# Patient Record
Sex: Female | Born: 1965
Health system: Southern US, Community
[De-identification: ages and names within clinical notes are randomized; demographics above are authoritative.]

## PROBLEM LIST (undated history)

## (undated) DIAGNOSIS — I10 Essential (primary) hypertension: Secondary | ICD-10-CM

## (undated) HISTORY — PX: BREAST EXCISIONAL BIOPSY: SUR124

---

## 2009-04-23 ENCOUNTER — Inpatient Hospital Stay (HOSPITAL_COMMUNITY): Admission: AD | Admit: 2009-04-23 | Discharge: 2009-04-25 | Payer: Self-pay | Admitting: Obstetrics and Gynecology

## 2009-04-23 ENCOUNTER — Ambulatory Visit: Payer: Self-pay | Admitting: Family Medicine

## 2009-05-16 ENCOUNTER — Encounter: Admission: RE | Admit: 2009-05-16 | Discharge: 2009-05-16 | Payer: Self-pay | Admitting: Family Medicine

## 2009-08-22 ENCOUNTER — Encounter: Admission: RE | Admit: 2009-08-22 | Discharge: 2009-08-22 | Payer: Self-pay | Admitting: Family Medicine

## 2010-12-22 ENCOUNTER — Encounter: Payer: Self-pay | Admitting: *Deleted

## 2011-02-24 ENCOUNTER — Emergency Department (HOSPITAL_COMMUNITY)
Admission: EM | Admit: 2011-02-24 | Discharge: 2011-02-25 | Disposition: A | Discharge status: Home / Self Care | Payer: Self-pay | Attending: Emergency Medicine | Admitting: Emergency Medicine

## 2011-02-24 ENCOUNTER — Emergency Department (HOSPITAL_COMMUNITY): Payer: Self-pay

## 2011-02-24 DIAGNOSIS — M545 Low back pain, unspecified: Secondary | ICD-10-CM | POA: Insufficient documentation

## 2011-02-24 DIAGNOSIS — R079 Chest pain, unspecified: Secondary | ICD-10-CM | POA: Insufficient documentation

## 2011-02-24 DIAGNOSIS — M25519 Pain in unspecified shoulder: Secondary | ICD-10-CM | POA: Insufficient documentation

## 2011-02-25 LAB — POCT CARDIAC MARKERS: Myoglobin, poc: 62.6 ng/mL (ref 12–200)

## 2011-03-11 LAB — CBC
HCT: 39.6 % (ref 36.0–46.0)
MCHC: 35 g/dL (ref 30.0–36.0)
MCV: 102.1 fL — ABNORMAL HIGH (ref 78.0–100.0)
Platelets: 198 10*3/uL (ref 150–400)
RDW: 14.2 % (ref 11.5–15.5)

## 2012-12-28 ENCOUNTER — Emergency Department (HOSPITAL_COMMUNITY)
Admission: EM | Admit: 2012-12-28 | Discharge: 2012-12-28 | Disposition: A | Discharge status: Home / Self Care | Payer: BC Managed Care – PPO | Attending: Emergency Medicine | Admitting: Emergency Medicine

## 2012-12-28 ENCOUNTER — Emergency Department (HOSPITAL_COMMUNITY): Payer: BC Managed Care – PPO

## 2012-12-28 ENCOUNTER — Encounter (HOSPITAL_COMMUNITY): Payer: Self-pay | Admitting: *Deleted

## 2012-12-28 DIAGNOSIS — J40 Bronchitis, not specified as acute or chronic: Secondary | ICD-10-CM | POA: Insufficient documentation

## 2012-12-28 DIAGNOSIS — X58XXXA Exposure to other specified factors, initial encounter: Secondary | ICD-10-CM | POA: Insufficient documentation

## 2012-12-28 DIAGNOSIS — Y929 Unspecified place or not applicable: Secondary | ICD-10-CM | POA: Insufficient documentation

## 2012-12-28 DIAGNOSIS — S29011A Strain of muscle and tendon of front wall of thorax, initial encounter: Secondary | ICD-10-CM

## 2012-12-28 DIAGNOSIS — IMO0002 Reserved for concepts with insufficient information to code with codable children: Secondary | ICD-10-CM | POA: Insufficient documentation

## 2012-12-28 DIAGNOSIS — R059 Cough, unspecified: Secondary | ICD-10-CM | POA: Insufficient documentation

## 2012-12-28 DIAGNOSIS — R05 Cough: Secondary | ICD-10-CM | POA: Insufficient documentation

## 2012-12-28 DIAGNOSIS — Y939 Activity, unspecified: Secondary | ICD-10-CM | POA: Insufficient documentation

## 2012-12-28 LAB — CBC
Hemoglobin: 13.4 g/dL (ref 12.0–15.0)
MCH: 32.4 pg (ref 26.0–34.0)
MCHC: 34 g/dL (ref 30.0–36.0)

## 2012-12-28 LAB — BASIC METABOLIC PANEL
BUN: 11 mg/dL (ref 6–23)
Calcium: 9.7 mg/dL (ref 8.4–10.5)
Creatinine, Ser: 0.85 mg/dL (ref 0.50–1.10)
GFR calc non Af Amer: 81 mL/min — ABNORMAL LOW (ref >90)
Glucose, Bld: 98 mg/dL (ref 70–99)
Potassium: 3.4 mEq/L — ABNORMAL LOW (ref 3.5–5.1)

## 2012-12-28 LAB — POCT I-STAT TROPONIN I: Troponin i, poc: 0.01 ng/mL (ref 0.00–0.08)

## 2012-12-28 MED ORDER — HYDROCOD POLST-CHLORPHEN POLST 10-8 MG/5ML PO LQCR: 5.0000 mL | Freq: Two times a day (BID) | ORAL | Status: AC | PRN

## 2012-12-28 MED ORDER — HYDROCOD POLST-CHLORPHEN POLST 10-8 MG/5ML PO LQCR
5.0000 mL | Freq: Once | ORAL | Status: AC
  Administered 2012-12-28: 5 mL via ORAL
  Filled 2012-12-28: qty 5

## 2012-12-28 MED ORDER — IBUPROFEN 800 MG PO TABS: 800.0000 mg | ORAL_TABLET | Freq: Four times a day (QID) | ORAL | Status: AC | PRN

## 2012-12-28 MED ORDER — IBUPROFEN 800 MG PO TABS
800.0000 mg | ORAL_TABLET | Freq: Once | ORAL | Status: AC
  Administered 2012-12-28: 800 mg via ORAL
  Filled 2012-12-28: qty 1

## 2012-12-28 MED ORDER — AZITHROMYCIN 250 MG PO TABS: ORAL_TABLET | ORAL | Status: DC

## 2012-12-28 MED ORDER — AZITHROMYCIN 250 MG PO TABS
500.0000 mg | ORAL_TABLET | Freq: Once | ORAL | Status: AC
  Administered 2012-12-28: 500 mg via ORAL
  Filled 2012-12-28: qty 2

## 2012-12-28 NOTE — ED Notes (Signed)
Patient complaining of chest pain that started about two hours ago; woke patient up from sleep tonight.  Pain is constant; location is center of chest. Patient denies shortness of breath; pain worsens upon cough and deep inspiration.  Patient reports cough x2 weeks; denies fever.  Initially sputum was green/yellow (two weeks ago); is now clear.  Denies smoking history.

## 2012-12-28 NOTE — ED Notes (Signed)
Patient given copy of discharge paperwork; went over discharge instructions with patient.  Patient instructed to take prescriptions as directed, to finish entire antibiotic prescription, to not drive while taking narcotics, to follow up with primary care physician, and to return to the ED for new, worsening, or concerning symptoms.

## 2012-12-28 NOTE — ED Provider Notes (Signed)
History     CSN: [id number]  Arrival date & time 12/28/12  0204   First MD Initiated Contact with Patient 12/28/12 0254      Chief Complaint  Patient presents with  . Chest Pain    (Consider location/radiation/quality/duration/timing/severity/associated sxs/prior treatment) HPI 47 year old female presents to emergency room complaining of chest pain. She reports she woke this morning around 1 AM with sharp central chest pain. It is worse when she takes a breath, moves or palpates her chest. She reports she has had 2 weeks of worsening cough. She denies any fever. She's not a smoker. She denies any previous medical problems. No leg swelling, low no prolonged immobilization no other risk factors for PE or DVT.  History reviewed. No pertinent past medical history.  History reviewed. No pertinent past surgical history.  History reviewed. No pertinent family history.  History  Substance Use Topics  . Smoking status: Never Smoker   . Smokeless tobacco: Not on file  . Alcohol Use: No    OB History    Grav Para Term Preterm Abortions TAB SAB Ect Mult Living                  Review of Systems  See History of Present Illness; otherwise all other systems are reviewed and negative  Allergies  Quinine derivatives  Home Medications   Current Outpatient Rx  Name  Route  Sig  Dispense  Refill  . AZITHROMYCIN 250 MG PO TABS      Take one tab po q day starting on 1/29   4 each   0   . HYDROCOD POLST-CPM POLST ER 10-8 MG/5ML PO LQCR   Oral   Take 5 mLs by mouth every 12 (twelve) hours as needed (cough).   140 mL   0   . IBUPROFEN 800 MG PO TABS   Oral   Take 1 tablet (800 mg total) by mouth every 6 (six) hours as needed for pain.   30 tablet   0     BP 117/68  Pulse 76  Temp 98.2 F (36.8 C) (Oral)  Resp 16  SpO2 100%  LMP 11/27/2012  Physical Exam  Nursing note and vitals reviewed. Constitutional: She is oriented to person, place, and time. She appears  well-developed and well-nourished. No distress.  HENT:  Head: Normocephalic and atraumatic.  Nose: Nose normal.  Mouth/Throat: Oropharynx is clear and moist.  Eyes: Conjunctivae normal and EOM are normal. Pupils are equal, round, and reactive to light.  Neck: Normal range of motion. Neck supple. No JVD present. No tracheal deviation present. No thyromegaly present.  Cardiovascular: Normal rate, regular rhythm, normal heart sounds and intact distal pulses.  Exam reveals no gallop and no friction rub.   No murmur heard. Pulmonary/Chest: Effort normal and breath sounds normal. No stridor. No respiratory distress. She has no wheezes. She has no rales. She exhibits tenderness (palpation of the chest reproduces pain).       Patient has cough  Abdominal: Soft. Bowel sounds are normal. She exhibits no distension and no mass. There is no tenderness. There is no rebound and no guarding.  Musculoskeletal: Normal range of motion. She exhibits no edema and no tenderness.  Lymphadenopathy:    She has no cervical adenopathy.  Neurological: She is alert and oriented to person, place, and time. She exhibits normal muscle tone. Coordination normal.  Skin: Skin is warm and dry. No rash noted. No erythema. No pallor.  Psychiatric: She has a  normal mood and affect. Her behavior is normal. Judgment and thought content normal.    ED Course  Procedures (including critical care time)  Labs Reviewed  BASIC METABOLIC PANEL - Abnormal; Notable for the following:    Sodium 134 (*)     Potassium 3.4 (*)     GFR calc non Af Amer 81 (*)     All other components within normal limits  CBC  POCT I-STAT TROPONIN I  LAB REPORT - SCANNED   Dg Chest 2 View  12/28/2012  *RADIOLOGY REPORT*  Clinical Data: Chest pain  CHEST - 2 VIEW  Comparison: 02/24/2011  Findings: Mild bronchitic change.  Lungs are otherwise clear. No pleural effusion or pneumothorax. The cardiomediastinal contours are within normal limits. The  visualized bones and soft tissues are without significant appreciable abnormality.  IMPRESSION: Mild bronchitic change without confluent airspace opacity.   Original Report Authenticated By: Jearld Lesch, M.D.     Date: 12/28/2012  Rate:60  Rhythm: normal sinus rhythm  QRS Axis: normal  Intervals: normal  ST/T Wave abnormalities: normal  Conduction Disutrbances:none  Narrative Interpretation:   Old EKG Reviewed: unchanged    1. Bronchitis   2. Chest wall muscle strain       MDM  47 year old female with chest pain. Pain appears to be chest wall pain, she's had recent cough for 2 weeks. EKG unremarkable. Palpation of chest reproduces the pain. Will treat pain and cough.        Olivia Mackie, MD 12/28/12 [phone]

## 2012-12-28 NOTE — ED Notes (Signed)
Dr. Norlene Campbell at bedside explaining test results.

## 2012-12-28 NOTE — ED Notes (Signed)
Pt states that about an hour ago she was woke from her sleep with mid-sternal CP, sharp in nature.  Denies SOB, diaphoresis, weakness, dizziness.  Does feel slightly nauseous.

## 2012-12-28 NOTE — ED Notes (Signed)
I-Stat Troponin collected by phlebotomist.

## 2013-05-12 ENCOUNTER — Other Ambulatory Visit (HOSPITAL_COMMUNITY)
Admission: RE | Admit: 2013-05-12 | Discharge: 2013-05-12 | Disposition: A | Discharge status: Home / Self Care | Payer: BC Managed Care – PPO | Source: Ambulatory Visit | Attending: Family Medicine | Admitting: Family Medicine

## 2013-05-12 DIAGNOSIS — Z124 Encounter for screening for malignant neoplasm of cervix: Secondary | ICD-10-CM | POA: Insufficient documentation

## 2013-05-12 DIAGNOSIS — Z1151 Encounter for screening for human papillomavirus (HPV): Secondary | ICD-10-CM | POA: Insufficient documentation

## 2013-06-13 ENCOUNTER — Other Ambulatory Visit: Payer: Self-pay

## 2013-06-13 DIAGNOSIS — Z1231 Encounter for screening mammogram for malignant neoplasm of breast: Secondary | ICD-10-CM

## 2013-07-06 ENCOUNTER — Ambulatory Visit
Admission: RE | Admit: 2013-07-06 | Discharge: 2013-07-06 | Disposition: A | Discharge status: Home / Self Care | Payer: BC Managed Care – PPO | Source: Ambulatory Visit

## 2013-07-06 DIAGNOSIS — Z1231 Encounter for screening mammogram for malignant neoplasm of breast: Secondary | ICD-10-CM

## 2014-07-18 ENCOUNTER — Other Ambulatory Visit: Payer: Self-pay | Admitting: Gastroenterology

## 2014-07-18 DIAGNOSIS — R1013 Epigastric pain: Secondary | ICD-10-CM

## 2014-07-18 DIAGNOSIS — R194 Change in bowel habit: Secondary | ICD-10-CM

## 2014-07-28 ENCOUNTER — Ambulatory Visit
Admission: RE | Admit: 2014-07-28 | Discharge: 2014-07-28 | Disposition: A | Discharge status: Home / Self Care | Payer: PRIVATE HEALTH INSURANCE | Source: Ambulatory Visit | Attending: Gastroenterology | Admitting: Gastroenterology

## 2014-07-28 DIAGNOSIS — R194 Change in bowel habit: Secondary | ICD-10-CM

## 2014-07-28 DIAGNOSIS — R1013 Epigastric pain: Secondary | ICD-10-CM

## 2014-07-28 MED ORDER — IOHEXOL 300 MG/ML  SOLN
100.0000 mL | Freq: Once | INTRAMUSCULAR | Status: AC | PRN
  Administered 2014-07-28: 100 mL via INTRAVENOUS

## 2014-08-25 ENCOUNTER — Other Ambulatory Visit: Payer: Self-pay | Admitting: Family Medicine

## 2014-08-25 ENCOUNTER — Other Ambulatory Visit (HOSPITAL_COMMUNITY)
Admission: RE | Admit: 2014-08-25 | Discharge: 2014-08-25 | Disposition: A | Discharge status: Home / Self Care | Payer: PRIVATE HEALTH INSURANCE | Source: Ambulatory Visit | Attending: Family Medicine | Admitting: Family Medicine

## 2014-08-25 DIAGNOSIS — Z1151 Encounter for screening for human papillomavirus (HPV): Secondary | ICD-10-CM | POA: Insufficient documentation

## 2014-08-25 DIAGNOSIS — Z113 Encounter for screening for infections with a predominantly sexual mode of transmission: Secondary | ICD-10-CM | POA: Insufficient documentation

## 2014-08-25 DIAGNOSIS — Z124 Encounter for screening for malignant neoplasm of cervix: Secondary | ICD-10-CM | POA: Insufficient documentation

## 2014-08-25 DIAGNOSIS — R87810 Cervical high risk human papillomavirus (HPV) DNA test positive: Secondary | ICD-10-CM | POA: Insufficient documentation

## 2014-08-29 LAB — CYTOLOGY - PAP

## 2014-10-02 ENCOUNTER — Other Ambulatory Visit: Payer: Self-pay

## 2014-10-02 DIAGNOSIS — Z1231 Encounter for screening mammogram for malignant neoplasm of breast: Secondary | ICD-10-CM

## 2014-10-25 ENCOUNTER — Ambulatory Visit
Admission: RE | Admit: 2014-10-25 | Discharge: 2014-10-25 | Disposition: A | Discharge status: Home / Self Care | Payer: PRIVATE HEALTH INSURANCE | Source: Ambulatory Visit

## 2014-10-25 DIAGNOSIS — Z1231 Encounter for screening mammogram for malignant neoplasm of breast: Secondary | ICD-10-CM

## 2015-09-21 ENCOUNTER — Other Ambulatory Visit: Payer: Self-pay

## 2015-09-21 DIAGNOSIS — Z1231 Encounter for screening mammogram for malignant neoplasm of breast: Secondary | ICD-10-CM

## 2015-09-26 ENCOUNTER — Other Ambulatory Visit: Payer: Self-pay | Admitting: Family Medicine

## 2015-09-26 ENCOUNTER — Other Ambulatory Visit (HOSPITAL_COMMUNITY)
Admission: RE | Admit: 2015-09-26 | Discharge: 2015-09-26 | Disposition: A | Discharge status: Home / Self Care | Payer: PRIVATE HEALTH INSURANCE | Source: Ambulatory Visit | Attending: Family Medicine | Admitting: Family Medicine

## 2015-09-26 DIAGNOSIS — R87619 Unspecified abnormal cytological findings in specimens from cervix uteri: Secondary | ICD-10-CM | POA: Insufficient documentation

## 2015-09-26 DIAGNOSIS — Z1151 Encounter for screening for human papillomavirus (HPV): Secondary | ICD-10-CM | POA: Insufficient documentation

## 2015-10-01 LAB — CYTOLOGY - PAP

## 2015-12-07 ENCOUNTER — Other Ambulatory Visit: Payer: Self-pay | Admitting: Family Medicine

## 2015-12-07 DIAGNOSIS — R102 Pelvic and perineal pain: Secondary | ICD-10-CM

## 2015-12-13 ENCOUNTER — Other Ambulatory Visit: Payer: PRIVATE HEALTH INSURANCE

## 2015-12-14 ENCOUNTER — Other Ambulatory Visit: Payer: PRIVATE HEALTH INSURANCE

## 2015-12-21 ENCOUNTER — Ambulatory Visit
Admission: RE | Admit: 2015-12-21 | Discharge: 2015-12-21 | Disposition: A | Discharge status: Home / Self Care | Payer: PRIVATE HEALTH INSURANCE | Source: Ambulatory Visit | Attending: Family Medicine | Admitting: Family Medicine

## 2015-12-21 DIAGNOSIS — R102 Pelvic and perineal pain: Secondary | ICD-10-CM

## 2016-06-11 ENCOUNTER — Other Ambulatory Visit: Payer: Self-pay | Admitting: Family Medicine

## 2016-06-11 DIAGNOSIS — N83209 Unspecified ovarian cyst, unspecified side: Secondary | ICD-10-CM

## 2016-08-21 ENCOUNTER — Ambulatory Visit
Admission: RE | Admit: 2016-08-21 | Discharge: 2016-08-21 | Disposition: A | Discharge status: Home / Self Care | Payer: Commercial Managed Care - PPO | Source: Ambulatory Visit | Attending: Family Medicine | Admitting: Family Medicine

## 2016-08-21 DIAGNOSIS — N83209 Unspecified ovarian cyst, unspecified side: Secondary | ICD-10-CM

## 2016-09-08 ENCOUNTER — Other Ambulatory Visit: Payer: Self-pay | Admitting: Family Medicine

## 2016-09-08 DIAGNOSIS — Z1231 Encounter for screening mammogram for malignant neoplasm of breast: Secondary | ICD-10-CM

## 2016-09-17 ENCOUNTER — Ambulatory Visit: Payer: Commercial Managed Care - PPO

## 2016-10-29 ENCOUNTER — Ambulatory Visit: Payer: Commercial Managed Care - PPO

## 2017-02-10 ENCOUNTER — Other Ambulatory Visit: Payer: Self-pay | Admitting: Obstetrics and Gynecology

## 2017-02-10 ENCOUNTER — Ambulatory Visit
Admission: RE | Admit: 2017-02-10 | Discharge: 2017-02-10 | Disposition: A | Discharge status: Home / Self Care | Payer: Commercial Managed Care - PPO | Source: Ambulatory Visit | Attending: Family Medicine | Admitting: Family Medicine

## 2017-02-10 ENCOUNTER — Ambulatory Visit: Payer: Commercial Managed Care - PPO

## 2017-02-10 DIAGNOSIS — N6320 Unspecified lump in the left breast, unspecified quadrant: Secondary | ICD-10-CM

## 2017-02-10 DIAGNOSIS — Z1231 Encounter for screening mammogram for malignant neoplasm of breast: Secondary | ICD-10-CM

## 2017-02-12 ENCOUNTER — Ambulatory Visit
Admission: RE | Admit: 2017-02-12 | Discharge: 2017-02-12 | Disposition: A | Discharge status: Home / Self Care | Payer: Commercial Managed Care - PPO | Source: Ambulatory Visit | Attending: Obstetrics and Gynecology | Admitting: Obstetrics and Gynecology

## 2017-02-12 ENCOUNTER — Other Ambulatory Visit: Payer: Self-pay | Admitting: Obstetrics and Gynecology

## 2017-02-12 ENCOUNTER — Other Ambulatory Visit: Payer: Commercial Managed Care - PPO

## 2017-02-12 DIAGNOSIS — N6320 Unspecified lump in the left breast, unspecified quadrant: Secondary | ICD-10-CM

## 2017-02-19 ENCOUNTER — Other Ambulatory Visit: Payer: Commercial Managed Care - PPO

## 2017-02-19 ENCOUNTER — Other Ambulatory Visit: Payer: Self-pay | Admitting: Obstetrics and Gynecology

## 2017-02-19 DIAGNOSIS — N6320 Unspecified lump in the left breast, unspecified quadrant: Secondary | ICD-10-CM

## 2017-02-20 ENCOUNTER — Ambulatory Visit
Admission: RE | Admit: 2017-02-20 | Discharge: 2017-02-20 | Disposition: A | Discharge status: Home / Self Care | Payer: Commercial Managed Care - PPO | Source: Ambulatory Visit | Attending: Obstetrics and Gynecology | Admitting: Obstetrics and Gynecology

## 2017-02-20 DIAGNOSIS — N6320 Unspecified lump in the left breast, unspecified quadrant: Secondary | ICD-10-CM

## 2017-07-27 ENCOUNTER — Other Ambulatory Visit: Payer: Self-pay | Admitting: Obstetrics and Gynecology

## 2017-07-27 ENCOUNTER — Other Ambulatory Visit (HOSPITAL_COMMUNITY)
Admission: RE | Admit: 2017-07-27 | Discharge: 2017-07-27 | Disposition: A | Discharge status: Home / Self Care | Payer: BLUE CROSS/BLUE SHIELD | Source: Ambulatory Visit | Attending: Obstetrics and Gynecology | Admitting: Obstetrics and Gynecology

## 2017-07-27 DIAGNOSIS — Z01419 Encounter for gynecological examination (general) (routine) without abnormal findings: Secondary | ICD-10-CM | POA: Insufficient documentation

## 2017-07-28 LAB — CYTOLOGY - PAP: DIAGNOSIS: NEGATIVE

## 2018-02-17 DIAGNOSIS — I10 Essential (primary) hypertension: Secondary | ICD-10-CM | POA: Diagnosis not present

## 2018-02-17 DIAGNOSIS — N760 Acute vaginitis: Secondary | ICD-10-CM | POA: Diagnosis not present

## 2018-09-06 ENCOUNTER — Other Ambulatory Visit: Payer: Self-pay | Admitting: Obstetrics and Gynecology

## 2018-09-06 ENCOUNTER — Other Ambulatory Visit (HOSPITAL_COMMUNITY)
Admission: RE | Admit: 2018-09-06 | Discharge: 2018-09-06 | Disposition: A | Discharge status: Home / Self Care | Payer: BLUE CROSS/BLUE SHIELD | Source: Ambulatory Visit | Attending: Obstetrics and Gynecology | Admitting: Obstetrics and Gynecology

## 2018-09-06 DIAGNOSIS — Z01419 Encounter for gynecological examination (general) (routine) without abnormal findings: Secondary | ICD-10-CM | POA: Insufficient documentation

## 2018-09-08 LAB — CYTOLOGY - PAP
Diagnosis: NEGATIVE
HPV: NOT DETECTED

## 2018-09-13 DIAGNOSIS — Z13220 Encounter for screening for lipoid disorders: Secondary | ICD-10-CM | POA: Diagnosis not present

## 2018-09-13 DIAGNOSIS — Z01419 Encounter for gynecological examination (general) (routine) without abnormal findings: Secondary | ICD-10-CM | POA: Diagnosis not present

## 2018-09-13 DIAGNOSIS — Z131 Encounter for screening for diabetes mellitus: Secondary | ICD-10-CM | POA: Diagnosis not present

## 2018-10-13 ENCOUNTER — Other Ambulatory Visit: Payer: Self-pay | Admitting: Obstetrics and Gynecology

## 2018-10-13 DIAGNOSIS — N6320 Unspecified lump in the left breast, unspecified quadrant: Secondary | ICD-10-CM

## 2018-10-19 ENCOUNTER — Ambulatory Visit
Admission: RE | Admit: 2018-10-19 | Discharge: 2018-10-19 | Disposition: A | Discharge status: Home / Self Care | Payer: BLUE CROSS/BLUE SHIELD | Source: Ambulatory Visit | Attending: Obstetrics and Gynecology | Admitting: Obstetrics and Gynecology

## 2018-10-19 DIAGNOSIS — R922 Inconclusive mammogram: Secondary | ICD-10-CM | POA: Diagnosis not present

## 2018-10-19 DIAGNOSIS — N6321 Unspecified lump in the left breast, upper outer quadrant: Secondary | ICD-10-CM | POA: Diagnosis not present

## 2018-10-19 DIAGNOSIS — N6320 Unspecified lump in the left breast, unspecified quadrant: Secondary | ICD-10-CM

## 2019-01-04 DIAGNOSIS — R69 Illness, unspecified: Secondary | ICD-10-CM | POA: Diagnosis not present

## 2019-07-13 DIAGNOSIS — R52 Pain, unspecified: Secondary | ICD-10-CM | POA: Diagnosis not present

## 2019-07-13 DIAGNOSIS — R030 Elevated blood-pressure reading, without diagnosis of hypertension: Secondary | ICD-10-CM | POA: Diagnosis not present

## 2019-07-18 DIAGNOSIS — R52 Pain, unspecified: Secondary | ICD-10-CM | POA: Diagnosis not present

## 2019-07-18 DIAGNOSIS — Z13220 Encounter for screening for lipoid disorders: Secondary | ICD-10-CM | POA: Diagnosis not present

## 2019-07-18 DIAGNOSIS — R030 Elevated blood-pressure reading, without diagnosis of hypertension: Secondary | ICD-10-CM | POA: Diagnosis not present

## 2019-07-18 DIAGNOSIS — Z8249 Family history of ischemic heart disease and other diseases of the circulatory system: Secondary | ICD-10-CM | POA: Diagnosis not present

## 2019-07-18 DIAGNOSIS — Z131 Encounter for screening for diabetes mellitus: Secondary | ICD-10-CM | POA: Diagnosis not present

## 2019-11-28 ENCOUNTER — Other Ambulatory Visit: Payer: Self-pay | Admitting: Obstetrics and Gynecology

## 2020-02-12 DIAGNOSIS — Z20828 Contact with and (suspected) exposure to other viral communicable diseases: Secondary | ICD-10-CM | POA: Diagnosis not present

## 2020-05-07 DIAGNOSIS — R143 Flatulence: Secondary | ICD-10-CM | POA: Diagnosis not present

## 2020-05-07 DIAGNOSIS — K429 Umbilical hernia without obstruction or gangrene: Secondary | ICD-10-CM | POA: Diagnosis not present

## 2020-05-07 DIAGNOSIS — K5900 Constipation, unspecified: Secondary | ICD-10-CM | POA: Diagnosis not present

## 2020-05-07 DIAGNOSIS — R198 Other specified symptoms and signs involving the digestive system and abdomen: Secondary | ICD-10-CM | POA: Diagnosis not present

## 2020-05-22 DIAGNOSIS — E559 Vitamin D deficiency, unspecified: Secondary | ICD-10-CM | POA: Diagnosis not present

## 2020-05-22 DIAGNOSIS — R109 Unspecified abdominal pain: Secondary | ICD-10-CM | POA: Diagnosis not present

## 2020-05-23 ENCOUNTER — Emergency Department (HOSPITAL_COMMUNITY)
Admission: EM | Admit: 2020-05-23 | Discharge: 2020-05-23 | Disposition: A | Discharge status: Home / Self Care | Payer: BC Managed Care – PPO | Attending: Emergency Medicine | Admitting: Emergency Medicine

## 2020-05-23 ENCOUNTER — Encounter (HOSPITAL_COMMUNITY): Payer: Self-pay

## 2020-05-23 ENCOUNTER — Emergency Department (HOSPITAL_COMMUNITY): Payer: BC Managed Care – PPO

## 2020-05-23 DIAGNOSIS — I10 Essential (primary) hypertension: Secondary | ICD-10-CM | POA: Insufficient documentation

## 2020-05-23 DIAGNOSIS — Z79899 Other long term (current) drug therapy: Secondary | ICD-10-CM | POA: Insufficient documentation

## 2020-05-23 DIAGNOSIS — R1032 Left lower quadrant pain: Secondary | ICD-10-CM | POA: Diagnosis not present

## 2020-05-23 DIAGNOSIS — R109 Unspecified abdominal pain: Secondary | ICD-10-CM | POA: Insufficient documentation

## 2020-05-23 DIAGNOSIS — N898 Other specified noninflammatory disorders of vagina: Secondary | ICD-10-CM | POA: Diagnosis not present

## 2020-05-23 DIAGNOSIS — N83202 Unspecified ovarian cyst, left side: Secondary | ICD-10-CM | POA: Diagnosis not present

## 2020-05-23 HISTORY — DX: Essential (primary) hypertension: I10

## 2020-05-23 LAB — URINALYSIS, ROUTINE W REFLEX MICROSCOPIC
Bilirubin Urine: NEGATIVE
Glucose, UA: NEGATIVE mg/dL
Hgb urine dipstick: NEGATIVE
Ketones, ur: NEGATIVE mg/dL
Leukocytes,Ua: NEGATIVE
Nitrite: NEGATIVE
Protein, ur: NEGATIVE mg/dL
Specific Gravity, Urine: 1.012 (ref 1.005–1.030)
pH: 5 (ref 5.0–8.0)

## 2020-05-23 LAB — COMPREHENSIVE METABOLIC PANEL
ALT: 11 U/L (ref 0–44)
AST: 18 U/L (ref 15–41)
Albumin: 3.8 g/dL (ref 3.5–5.0)
Alkaline Phosphatase: 59 U/L (ref 38–126)
Anion gap: 10 (ref 5–15)
BUN: 15 mg/dL (ref 6–20)
CO2: 22 mmol/L (ref 22–32)
Calcium: 9.5 mg/dL (ref 8.9–10.3)
Chloride: 106 mmol/L (ref 98–111)
Creatinine, Ser: 0.9 mg/dL (ref 0.44–1.00)
GFR calc Af Amer: >60 mL/min (ref >60)
GFR calc non Af Amer: >60 mL/min (ref >60)
Glucose, Bld: 104 mg/dL — ABNORMAL HIGH (ref 70–99)
Potassium: 4.2 mmol/L (ref 3.5–5.1)
Sodium: 138 mmol/L (ref 135–145)
Total Bilirubin: 1.1 mg/dL (ref 0.3–1.2)
Total Protein: 6.9 g/dL (ref 6.5–8.1)

## 2020-05-23 LAB — CBC
HCT: 41.1 % (ref 36.0–46.0)
Hemoglobin: 13.2 g/dL (ref 12.0–15.0)
MCH: 31.8 pg (ref 26.0–34.0)
MCHC: 32.1 g/dL (ref 30.0–36.0)
MCV: 99 fL (ref 80.0–100.0)
Platelets: 208 10*3/uL (ref 150–400)
RBC: 4.15 MIL/uL (ref 3.87–5.11)
RDW: 13.9 % (ref 11.5–15.5)
WBC: 8.1 10*3/uL (ref 4.0–10.5)
nRBC: 0 % (ref 0.0–0.2)

## 2020-05-23 LAB — I-STAT BETA HCG BLOOD, ED (MC, WL, AP ONLY): I-stat hCG, quantitative: <5 m[IU]/mL (ref <5)

## 2020-05-23 LAB — LIPASE, BLOOD: Lipase: 31 U/L (ref 11–51)

## 2020-05-23 MED ORDER — MORPHINE SULFATE (PF) 4 MG/ML IV SOLN: 4.0000 mg | Freq: Once | INTRAVENOUS | Status: DC

## 2020-05-23 MED ORDER — SODIUM CHLORIDE 0.9 % IV SOLN: 1000.0000 mL | INTRAVENOUS | Status: DC

## 2020-05-23 MED ORDER — BISACODYL 10 MG RE SUPP: 10.0000 mg | RECTAL | 0 refills | Status: AC | PRN

## 2020-05-23 MED ORDER — SODIUM CHLORIDE 0.9 % IV BOLUS (SEPSIS)
500.0000 mL | Freq: Once | INTRAVENOUS | Status: AC
  Administered 2020-05-23: 500 mL via INTRAVENOUS

## 2020-05-23 MED ORDER — IOHEXOL 300 MG/ML  SOLN
100.0000 mL | Freq: Once | INTRAMUSCULAR | Status: AC | PRN
  Administered 2020-05-23: 100 mL via INTRAVENOUS

## 2020-05-23 MED ORDER — SODIUM CHLORIDE 0.9% FLUSH
3.0000 mL | Freq: Once | INTRAVENOUS | Status: AC
Start: 2020-05-23 — End: 2020-05-23
  Administered 2020-05-23: 3 mL via INTRAVENOUS

## 2020-05-23 NOTE — ED Triage Notes (Signed)
Pt reports that she has been having L flank pain that radiates to her back fpr the past month on and off. Pt reports that her pain is relieved by having a BM normally. Pt reports that she also has an umbilical hernia that has been there for a while that she also wants checked.

## 2020-05-23 NOTE — ED Notes (Signed)
Husband [phone]

## 2020-05-23 NOTE — ED Notes (Signed)
Pt states that flank tightness is still present but moved higher in her flank region after CT, still 6/10 pain .

## 2020-05-23 NOTE — ED Notes (Addendum)
Spoke to this pt earlier in visit when pt came out to this RN while walking down hall towards another pt and shared being upset about waiting in lobby so long.  This RN encouraged pt to return to room to have conversation as pt was visible irritated. Apologized for wait time, explained triage methods.  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 Placed IV, documented. Later called CT for idea of where pt is on list, stated 2 in front of pt at that time, this RN spoke to pt that this may change if high acuity needs for CT arise. After 1.5hr pt comes into hallway again upset about CT scan wait time. Redirected pt to the room so as not to be involved in heated conversation in hallway. Spoke to pt again about reasons CT may be delayed and this RN's reasoning about not interrupting CT techs repeatedly for every pt waiting for CT. Pt insists that she does this upstairs and that upstairs CTs are done for her pts much more rapidly. This RN unable to effectively communicate with patient, calls CT per pt insistence. Pt is next on list and is taken to CT shortly after.

## 2020-05-23 NOTE — ED Provider Notes (Signed)
Patient presents with left flank pain.  She was initially seen by Dr. Clarice Pole.  She has been having some pain for about a month and her left flank which she points to her left mid back and just under her left rib cage.  She says gotten worse over the last few days and comes in waves.  She currently does not have any pain.  There is nothing that is reproducible.  She does not have any pain in her abdomen on my exam.  CT scan shows no acute abnormalities.  She does not have other symptoms that sound more concerning for PE.  No unilateral leg swelling.  No pleuritic pain.  No persistent symptoms.  No persistent shortness of breath.  She had 2 episodes previously of shortness of breath during pain attacks but no shortness of breath on ambulation or exertion.  No tachycardia or hypoxia.  Her labs are nonconcerning.  She was discharged home in good condition.  She does have problems with constipation and requested a prescription for suppository.  She will follow-up with her PCP regarding ongoing treatment and reevaluation.   Rolan Bucco, MD 05/23/20 [phone]

## 2020-05-23 NOTE — ED Provider Notes (Addendum)
[name] EMERGENCY DEPARTMENT Provider Note   CSN: [id number] Arrival date & time: 05/23/20  2423     History Chief Complaint  Patient presents with  . Flank Pain  . Abdominal Pain    [name] is a 54 y.o. female.  HPI Patient reports she has been having pain on her left flank and down to the lower abdomen for the past week.  Pain is like a intense pressure and fullness that radiates to the lower abdomen.  No associated nausea or vomiting.  She reports a couple of nights this week she awakened feeling short of breath.  She has not been having specific exertional dyspnea.  No cough no fever.  Patient reports she has had some mild constipation that has been treated by her PCP.  Bowel movements might give some temporary mild relief but pain typically recurs pretty quickly.  No vomiting.  No difficulty urinating or pain or burning.  Patient reports she has had some longstanding problems with pain with intercourse.  Vaginal dryness and burning quality with intercourse.  This has not been addressed with the patient's gynecologist.  She has never had a similar pain in the past.  She reports she did have problems with ovarian cysts several years ago but they had completely resolved.  She was seeing a gynecologist but has not seen one in a couple of years due to resolution of symptoms.  Patient has seen PCP and initially diagnosed with musculoskeletal pain.  She was tried on some symptomatic treatments with no improvement, as well as treatment for constipation.  Pain has worsened over the past 24 hours.  Patient was advised to come to the emergency department.    Past Medical History:  Diagnosis Date  . Hypertension     There are no problems to display for this patient.   Past Surgical History:  Procedure Laterality Date  . BREAST EXCISIONAL BIOPSY Left      OB History   No obstetric history on file.     No family history on file.  Social History    Tobacco Use  . Smoking status: Never Smoker  Substance Use Topics  . Alcohol use: No  . Drug use: No    Home Medications Prior to Admission medications   Medication Sig Start Date End Date Taking? Authorizing Provider  azithromycin (ZITHROMAX) 250 MG tablet Take one tab po q day starting on 1/29 12/28/12   Marisa Severin, MD  chlorpheniramine-HYDROcodone (TUSSIONEX) 10-8 MG/5ML LQCR Take 5 mLs by mouth every 12 (twelve) hours as needed (cough). 12/28/12   Marisa Severin, MD  ibuprofen (ADVIL,MOTRIN) 800 MG tablet Take 1 tablet (800 mg total) by mouth every 6 (six) hours as needed for pain. 12/28/12   Marisa Severin, MD    Allergies    Quinine derivatives  Review of Systems   Review of Systems 10 systems reviewed and negative except as per HPI Physical Exam Updated Vital Signs BP 126/67 (BP Location: Right Arm)   Pulse 63   Temp 98.2 F (36.8 C) (Oral)   Resp 16   Ht 5\' 6"  (1.676 m)   Wt 82.1 kg   SpO2 100%   BMI 29.21 kg/m   Physical Exam Constitutional:      Comments: Alert.  Well-nourished well-developed.  Nontoxic.  HENT:     Head: Normocephalic and atraumatic.     Mouth/Throat:     Mouth: Mucous membranes are moist.     Pharynx: Oropharynx is clear.  Eyes:     Extraocular Movements: Extraocular movements intact.  Cardiovascular:     Rate and Rhythm: Normal rate and regular rhythm.     Pulses: Normal pulses.     Heart sounds: Normal heart sounds.  Pulmonary:     Effort: Pulmonary effort is normal.     Breath sounds: Normal breath sounds.  Abdominal:     Comments: Abdomen is soft.  Moderate pain in the left lower quadrant adjacent to the umbilicus.  No guarding or palpable mass.  No CVA tenderness to percussion.  Musculoskeletal:        General: No swelling or tenderness. Normal range of motion.     Cervical back: Neck supple.     Right lower leg: No edema.     Left lower leg: No edema.  Skin:    General: Skin is warm and dry.  Neurological:     General: No  focal deficit present.     Mental Status: She is oriented to person, place, and time.     Coordination: Coordination normal.  Psychiatric:        Mood and Affect: Mood normal.     ED Results / Procedures / Treatments   Labs (all labs ordered are listed, but only abnormal results are displayed) Labs Reviewed  COMPREHENSIVE METABOLIC PANEL - Abnormal; Notable for the following components:      Result Value   Glucose, Bld 104 (*)    All other components within normal limits  URINALYSIS, ROUTINE W REFLEX MICROSCOPIC - Abnormal; Notable for the following components:   APPearance HAZY (*)    All other components within normal limits  LIPASE, BLOOD  CBC  I-STAT BETA HCG BLOOD, ED ([facility], [facility], [facility] ONLY)    EKG None  Radiology No results found.  Procedures Procedures (including critical care time)  Medications Ordered in ED Medications  sodium chloride flush (NS) 0.9 % injection 3 mL (has no administration in time range)  sodium chloride 0.9 % bolus 500 mL (has no administration in time range)    Followed by  0.9 %  sodium chloride infusion (has no administration in time range)  morphine 4 MG/ML injection 4 mg (has no administration in time range)    ED Course  I have reviewed the triage vital signs and the nursing notes.  Pertinent labs & imaging results that were available during my care of the patient were reviewed by me and considered in my medical decision making (see chart for details).    MDM Rules/Calculators/A&P                         Patient has had worsening pain despite symptomatic treatment by PCP.  We will proceed with CT abdomen pelvis.  Will treat pain with morphine 4 mg IV.  Patient is alert and nontoxic.  Otherwise well in appearance.  Dr. Tamera Punt to follow-up on results of CT scan for final disposition. Final Clinical Impression(s) / ED Diagnoses Final diagnoses:  Flank pain  Left ovarian cyst  Abdominal pain, unspecified abdominal location    Rx / DC  Orders ED Discharge Orders    None       Charlesetta Shanks, MD 05/23/20 1403    Charlesetta Shanks, MD 05/23/20 1616

## 2020-05-29 DIAGNOSIS — R109 Unspecified abdominal pain: Secondary | ICD-10-CM | POA: Diagnosis not present

## 2020-05-29 DIAGNOSIS — Z09 Encounter for follow-up examination after completed treatment for conditions other than malignant neoplasm: Secondary | ICD-10-CM | POA: Diagnosis not present

## 2020-07-11 DIAGNOSIS — Z0000 Encounter for general adult medical examination without abnormal findings: Secondary | ICD-10-CM | POA: Diagnosis not present

## 2020-07-11 DIAGNOSIS — E559 Vitamin D deficiency, unspecified: Secondary | ICD-10-CM | POA: Diagnosis not present

## 2020-07-11 DIAGNOSIS — I10 Essential (primary) hypertension: Secondary | ICD-10-CM | POA: Diagnosis not present

## 2020-09-13 ENCOUNTER — Other Ambulatory Visit: Payer: Self-pay | Admitting: Obstetrics and Gynecology

## 2020-09-13 DIAGNOSIS — N63 Unspecified lump in unspecified breast: Secondary | ICD-10-CM

## 2020-09-26 ENCOUNTER — Other Ambulatory Visit: Payer: Self-pay

## 2020-09-26 ENCOUNTER — Ambulatory Visit
Admission: RE | Admit: 2020-09-26 | Discharge: 2020-09-26 | Disposition: A | Discharge status: Home / Self Care | Payer: BC Managed Care – PPO | Source: Ambulatory Visit | Attending: Obstetrics and Gynecology | Admitting: Obstetrics and Gynecology

## 2020-09-26 DIAGNOSIS — N63 Unspecified lump in unspecified breast: Secondary | ICD-10-CM

## 2020-09-26 DIAGNOSIS — R922 Inconclusive mammogram: Secondary | ICD-10-CM | POA: Diagnosis not present

## 2020-09-26 DIAGNOSIS — N6012 Diffuse cystic mastopathy of left breast: Secondary | ICD-10-CM | POA: Diagnosis not present

## 2021-02-24 DIAGNOSIS — R079 Chest pain, unspecified: Secondary | ICD-10-CM | POA: Diagnosis not present

## 2021-02-24 DIAGNOSIS — R5383 Other fatigue: Secondary | ICD-10-CM | POA: Diagnosis not present

## 2021-08-06 DIAGNOSIS — Z124 Encounter for screening for malignant neoplasm of cervix: Secondary | ICD-10-CM | POA: Diagnosis not present

## 2021-08-06 DIAGNOSIS — Z13220 Encounter for screening for lipoid disorders: Secondary | ICD-10-CM | POA: Diagnosis not present

## 2021-08-06 DIAGNOSIS — E559 Vitamin D deficiency, unspecified: Secondary | ICD-10-CM | POA: Diagnosis not present

## 2021-08-06 DIAGNOSIS — I10 Essential (primary) hypertension: Secondary | ICD-10-CM | POA: Diagnosis not present

## 2022-06-04 ENCOUNTER — Other Ambulatory Visit: Payer: Self-pay | Admitting: Family Medicine

## 2022-06-04 DIAGNOSIS — Z1231 Encounter for screening mammogram for malignant neoplasm of breast: Secondary | ICD-10-CM

## 2022-06-25 ENCOUNTER — Ambulatory Visit: Payer: BC Managed Care – PPO

## 2022-07-02 ENCOUNTER — Ambulatory Visit
Admission: RE | Admit: 2022-07-02 | Discharge: 2022-07-02 | Disposition: A | Discharge status: Home / Self Care | Payer: BC Managed Care – PPO | Source: Ambulatory Visit | Attending: Family Medicine | Admitting: Family Medicine

## 2022-07-02 DIAGNOSIS — Z1231 Encounter for screening mammogram for malignant neoplasm of breast: Secondary | ICD-10-CM

## 2022-08-12 DIAGNOSIS — R232 Flushing: Secondary | ICD-10-CM | POA: Diagnosis not present

## 2022-08-12 DIAGNOSIS — Z23 Encounter for immunization: Secondary | ICD-10-CM | POA: Diagnosis not present

## 2022-08-12 DIAGNOSIS — Z124 Encounter for screening for malignant neoplasm of cervix: Secondary | ICD-10-CM | POA: Diagnosis not present

## 2022-08-12 DIAGNOSIS — Z0000 Encounter for general adult medical examination without abnormal findings: Secondary | ICD-10-CM | POA: Diagnosis not present

## 2022-08-12 DIAGNOSIS — E559 Vitamin D deficiency, unspecified: Secondary | ICD-10-CM | POA: Diagnosis not present

## 2022-08-12 DIAGNOSIS — I10 Essential (primary) hypertension: Secondary | ICD-10-CM | POA: Diagnosis not present

## 2022-09-01 IMAGING — MG DIGITAL DIAGNOSTIC BILAT W/ TOMO W/ CAD
8 series · 9 of 24 positions shown · non-contrast
Comparison: Previous exam(s).

CLINICAL DATA: Delayed follow-up of a probable benign cluster of
cysts in the left breast originally seen on ultrasound dated
02/12/2017.

EXAM:
DIGITAL DIAGNOSTIC BILATERAL MAMMOGRAM WITH CAD AND TOMO
ULTRASOUND LEFT BREAST

[R MLO synth-2D]
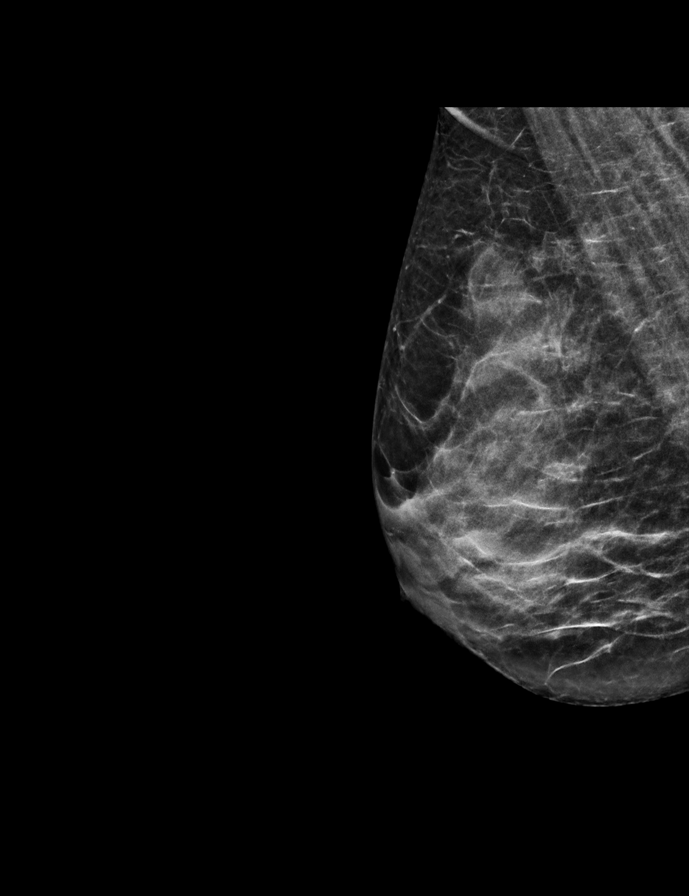

[R CC synth-2D]
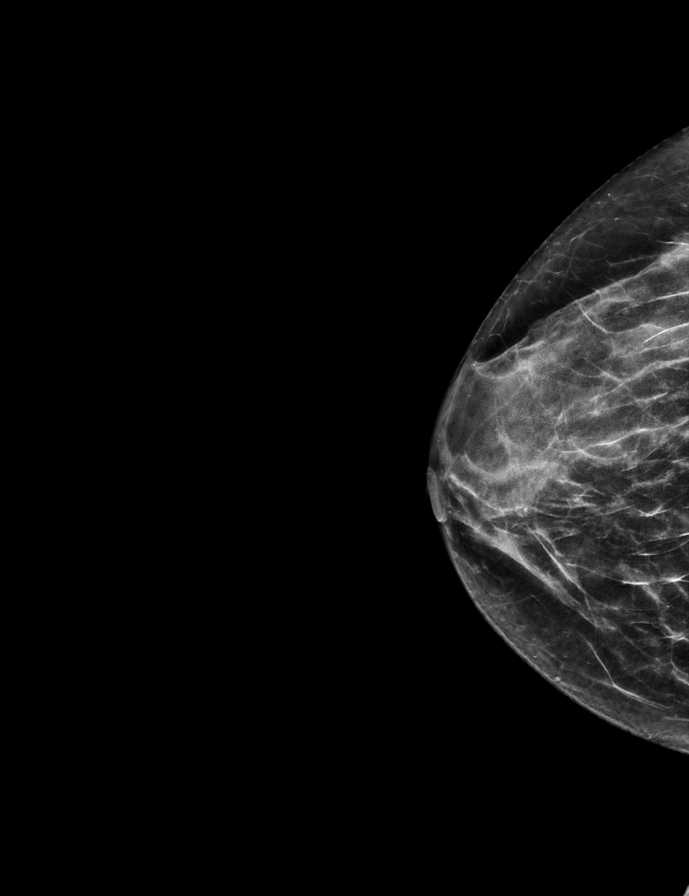

[L CC synth-2D]
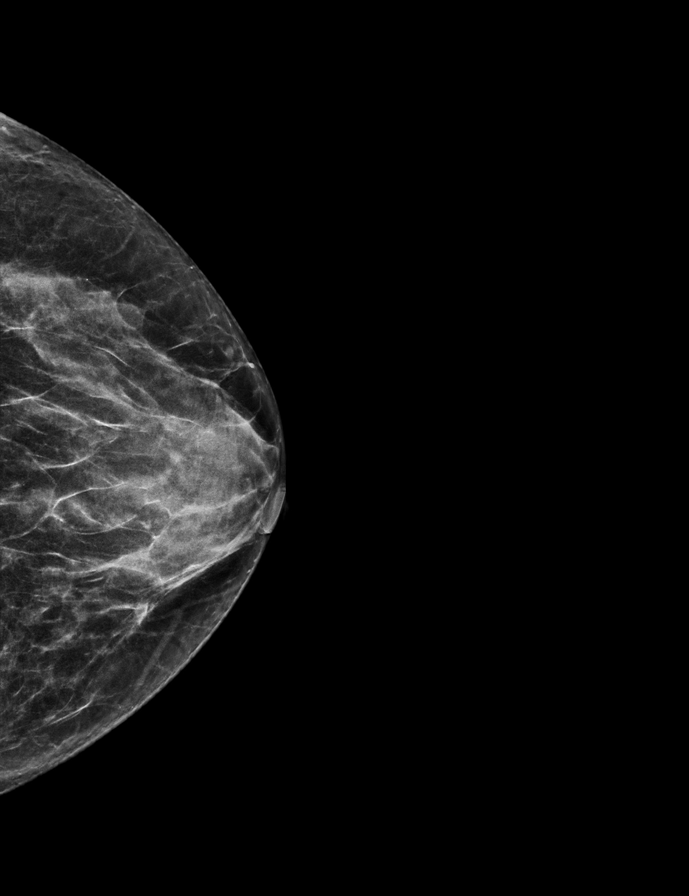

[L MLO synth-2D]
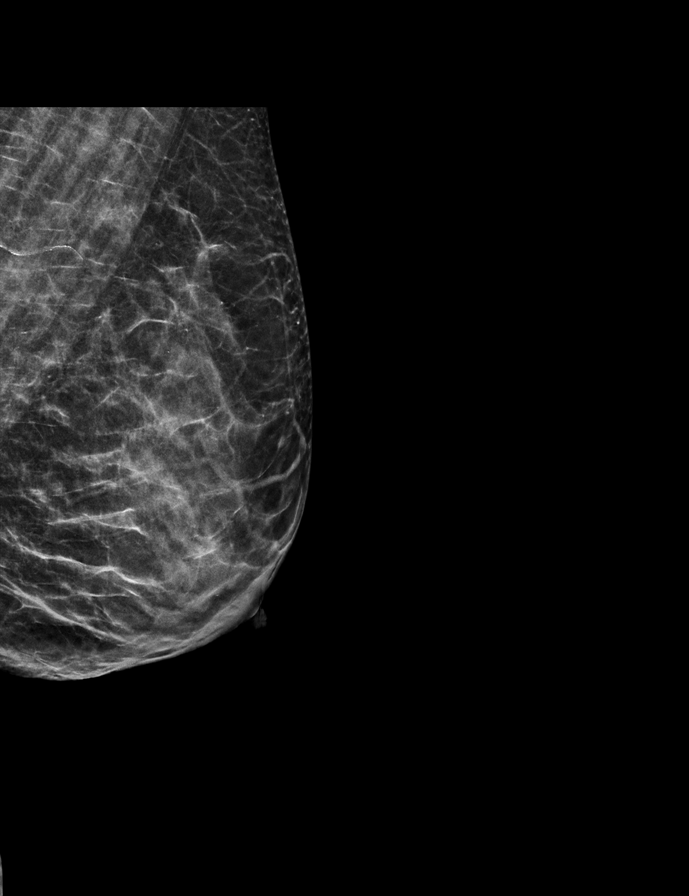

[R MLO tomo · 2 of 56 frames shown]
[frame 19/56]
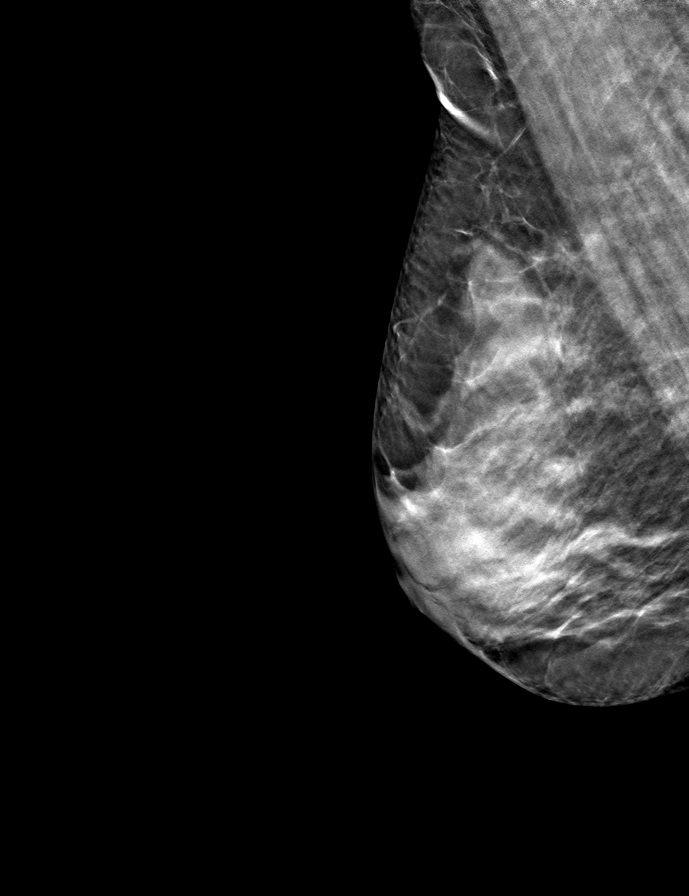
[frame 29/56]
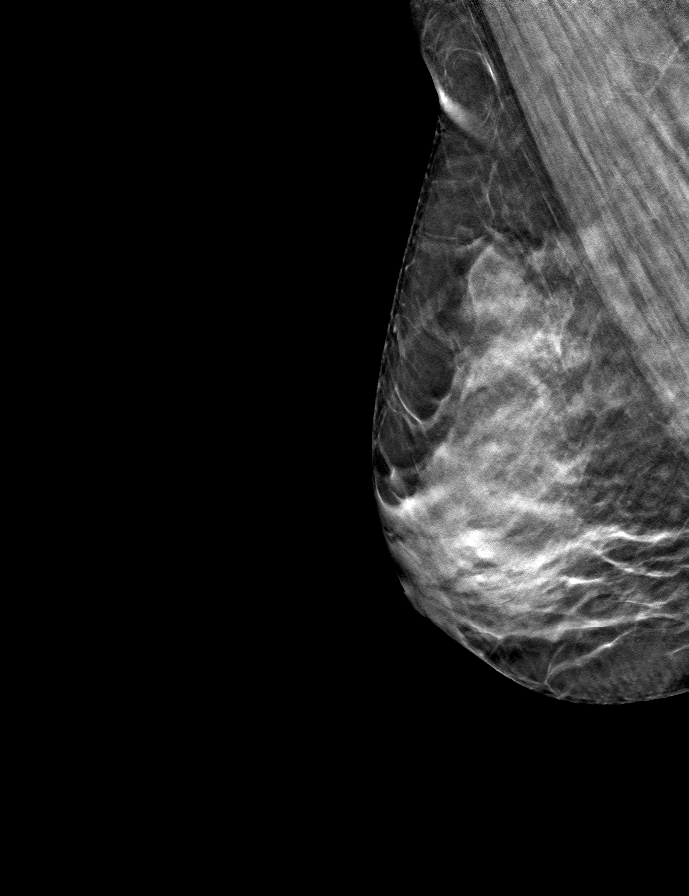

[L MLO tomo · tomo slice 27/54.0]
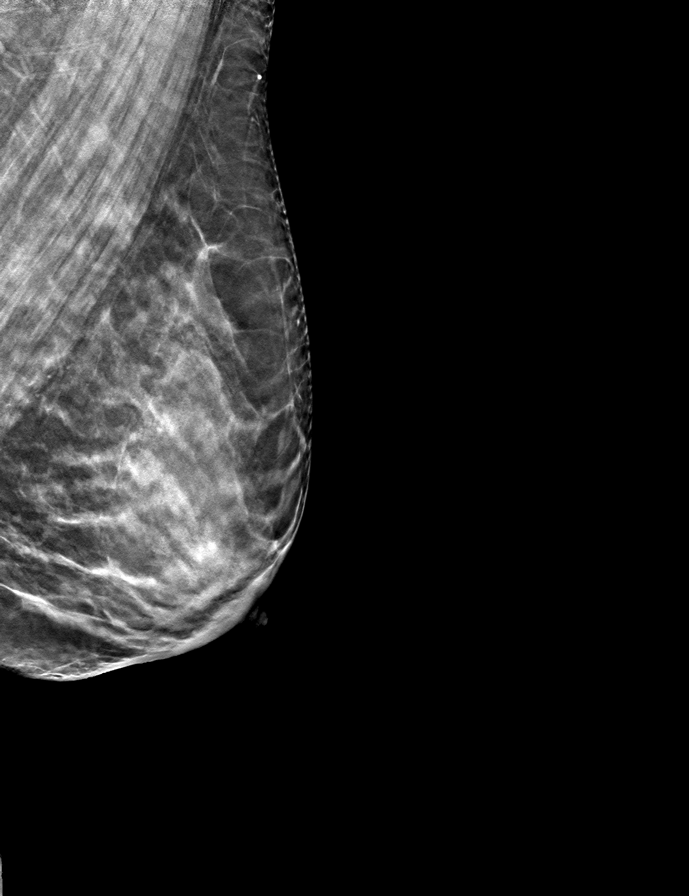

[R CC tomo · tomo slice 27/53.0]
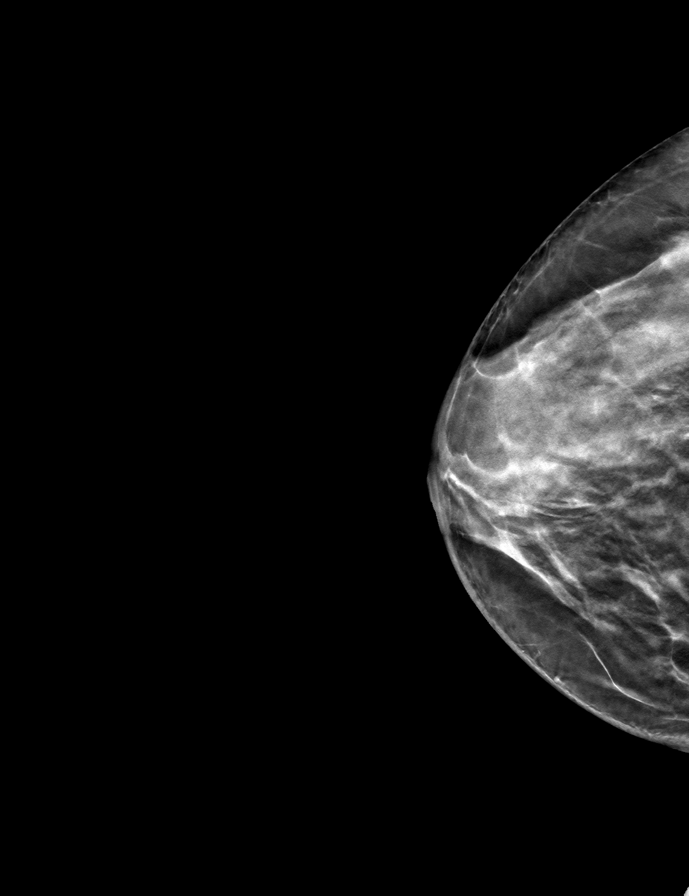

[L CC tomo · tomo slice 25/50.0]
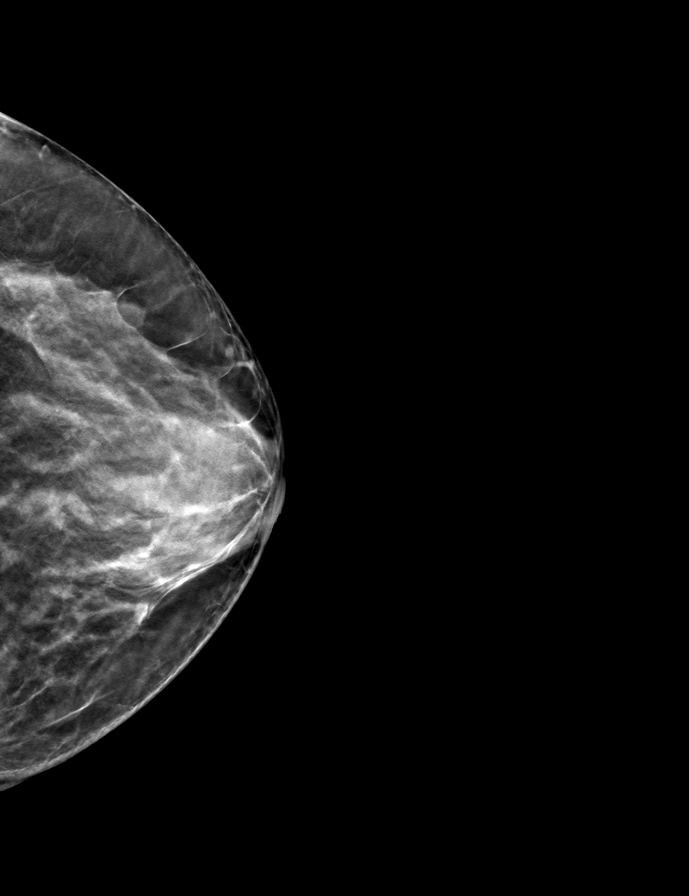

[9 of 24 positions shown; findings below may reference images not displayed]

ACR Breast Density Category c: The breast tissue is heterogeneously
dense, which may obscure small masses.
FINDINGS: No suspicious mass, malignant type microcalcifications or distortion
detected in either breast.

Mammographic images were processed with CAD.

Targeted ultrasound is performed, showing normal tissue in the left
breast at 2 o'clock 1 cm from the nipple. The previously seen
probable benign cluster of cyst is no longer visualized.
IMPRESSION: No evidence of malignancy in either breast.

RECOMMENDATION:
Bilateral screening mammogram in 1 year is recommended.

I have discussed the findings and recommendations with the patient.
If applicable, a reminder letter will be sent to the patient
regarding the next appointment.

BI-RADS CATEGORY  1: Negative.

## 2022-11-06 DIAGNOSIS — M7918 Myalgia, other site: Secondary | ICD-10-CM | POA: Diagnosis not present

## 2023-06-22 ENCOUNTER — Other Ambulatory Visit: Payer: Self-pay | Admitting: Family Medicine

## 2023-06-22 DIAGNOSIS — Z1231 Encounter for screening mammogram for malignant neoplasm of breast: Secondary | ICD-10-CM

## 2023-07-08 ENCOUNTER — Ambulatory Visit
Admission: RE | Admit: 2023-07-08 | Discharge: 2023-07-08 | Disposition: A | Discharge status: Home / Self Care | Payer: Managed Care, Other (non HMO) | Source: Ambulatory Visit | Attending: Family Medicine | Admitting: Family Medicine

## 2023-07-08 DIAGNOSIS — Z1231 Encounter for screening mammogram for malignant neoplasm of breast: Secondary | ICD-10-CM

## 2024-06-23 ENCOUNTER — Other Ambulatory Visit: Payer: Self-pay | Admitting: Family Medicine

## 2024-06-23 DIAGNOSIS — Z1231 Encounter for screening mammogram for malignant neoplasm of breast: Secondary | ICD-10-CM

## 2024-07-13 ENCOUNTER — Ambulatory Visit
Admission: RE | Admit: 2024-07-13 | Discharge: 2024-07-13 | Disposition: A | Discharge status: Home / Self Care | Payer: PRIVATE HEALTH INSURANCE | Source: Ambulatory Visit | Attending: Family Medicine | Admitting: Family Medicine

## 2024-07-13 DIAGNOSIS — Z1231 Encounter for screening mammogram for malignant neoplasm of breast: Secondary | ICD-10-CM
